# Patient Record
Sex: Female | Born: 1980 | Race: White | Hispanic: No | Marital: Married | Smoking: Current every day smoker
Health system: Southern US, Community
[De-identification: ages and names within clinical notes are randomized; demographics above are authoritative.]

## PROBLEM LIST (undated history)

## (undated) DIAGNOSIS — G43909 Migraine, unspecified, not intractable, without status migrainosus: Secondary | ICD-10-CM

## (undated) DIAGNOSIS — J45909 Unspecified asthma, uncomplicated: Secondary | ICD-10-CM

## (undated) DIAGNOSIS — E039 Hypothyroidism, unspecified: Secondary | ICD-10-CM

## (undated) DIAGNOSIS — E781 Pure hyperglyceridemia: Secondary | ICD-10-CM

## (undated) DIAGNOSIS — F329 Major depressive disorder, single episode, unspecified: Secondary | ICD-10-CM

## (undated) DIAGNOSIS — E559 Vitamin D deficiency, unspecified: Secondary | ICD-10-CM

## (undated) DIAGNOSIS — F419 Anxiety disorder, unspecified: Secondary | ICD-10-CM

## (undated) DIAGNOSIS — F32A Depression, unspecified: Secondary | ICD-10-CM

---

## 2013-10-31 ENCOUNTER — Encounter: Payer: Self-pay | Admitting: Emergency Medicine

## 2013-10-31 ENCOUNTER — Emergency Department (INDEPENDENT_AMBULATORY_CARE_PROVIDER_SITE_OTHER): Payer: BC Managed Care – PPO

## 2013-10-31 ENCOUNTER — Emergency Department
Admission: EM | Admit: 2013-10-31 | Discharge: 2013-10-31 | Disposition: A | Discharge status: Home / Self Care | Payer: BC Managed Care – PPO | Source: Home / Self Care | Attending: Family Medicine | Admitting: Family Medicine

## 2013-10-31 DIAGNOSIS — J45909 Unspecified asthma, uncomplicated: Secondary | ICD-10-CM | POA: Insufficient documentation

## 2013-10-31 DIAGNOSIS — E781 Pure hyperglyceridemia: Secondary | ICD-10-CM | POA: Insufficient documentation

## 2013-10-31 DIAGNOSIS — R059 Cough, unspecified: Secondary | ICD-10-CM

## 2013-10-31 DIAGNOSIS — F329 Major depressive disorder, single episode, unspecified: Secondary | ICD-10-CM | POA: Insufficient documentation

## 2013-10-31 DIAGNOSIS — E039 Hypothyroidism, unspecified: Secondary | ICD-10-CM | POA: Insufficient documentation

## 2013-10-31 DIAGNOSIS — F32A Depression, unspecified: Secondary | ICD-10-CM | POA: Insufficient documentation

## 2013-10-31 DIAGNOSIS — F419 Anxiety disorder, unspecified: Secondary | ICD-10-CM | POA: Insufficient documentation

## 2013-10-31 DIAGNOSIS — E559 Vitamin D deficiency, unspecified: Secondary | ICD-10-CM | POA: Insufficient documentation

## 2013-10-31 DIAGNOSIS — G43909 Migraine, unspecified, not intractable, without status migrainosus: Secondary | ICD-10-CM | POA: Insufficient documentation

## 2013-10-31 DIAGNOSIS — R05 Cough: Secondary | ICD-10-CM

## 2013-10-31 DIAGNOSIS — R61 Generalized hyperhidrosis: Secondary | ICD-10-CM

## 2013-10-31 DIAGNOSIS — R6883 Chills (without fever): Secondary | ICD-10-CM

## 2013-10-31 HISTORY — DX: Vitamin D deficiency, unspecified: E55.9

## 2013-10-31 HISTORY — DX: Migraine, unspecified, not intractable, without status migrainosus: G43.909

## 2013-10-31 HISTORY — DX: Unspecified asthma, uncomplicated: J45.909

## 2013-10-31 HISTORY — DX: Depression, unspecified: F32.A

## 2013-10-31 HISTORY — DX: Anxiety disorder, unspecified: F41.9

## 2013-10-31 HISTORY — DX: Pure hyperglyceridemia: E78.1

## 2013-10-31 HISTORY — DX: Hypothyroidism, unspecified: E03.9

## 2013-10-31 HISTORY — DX: Major depressive disorder, single episode, unspecified: F32.9

## 2013-10-31 LAB — POCT CBC W AUTO DIFF (K'VILLE URGENT CARE)

## 2013-10-31 MED ORDER — BENZONATATE 200 MG PO CAPS: 200.0000 mg | ORAL_CAPSULE | Freq: Every day | ORAL | Status: AC

## 2013-10-31 MED ORDER — BECLOMETHASONE DIPROPIONATE 40 MCG/ACT IN AERS: 2.0000 | INHALATION_SPRAY | Freq: Two times a day (BID) | RESPIRATORY_TRACT | Status: AC

## 2013-10-31 MED ORDER — LEVOFLOXACIN 500 MG PO TABS: 500.0000 mg | ORAL_TABLET | Freq: Every day | ORAL | Status: AC

## 2013-10-31 MED ORDER — PREDNISONE 20 MG PO TABS: 20.0000 mg | ORAL_TABLET | Freq: Two times a day (BID) | ORAL | Status: DC

## 2013-10-31 NOTE — ED Notes (Signed)
Erin Villarreal complains of dry cough for 3 weeks. She reports fevers, chills, sweats, body aches, dizziness, sore throat, congestion, wheezing, hoarseness and shortness of breath for 1 day. She has taken Mucinex DM, NyQuil and Robitussin with some relief.

## 2013-10-31 NOTE — ED Provider Notes (Signed)
CSN: [id number]     Arrival date & time 10/31/13  1745 History   First MD Initiated Contact with Patient 10/31/13 1755     Chief Complaint  Patient presents with  . Cough    x 3 weeks  . Generalized Body Aches    x 1 day  . Sore Throat    x 1 day      HPI Comments: Patient developed a sore throat and cough about 3 weeks ago, but not much nasal congestion.  Her non-productive cough has worsened and over the past several days she has had low grade fever and chills/sweats.  She has had increased wheezing and shortness of breath.  She has a history of asthma and has been using her albuterol inhaler more frequently. She has a history of pneumonia about 4 to 5 years ago.  She continues to smoke.  The history is provided by the patient.    Past Medical History  Diagnosis Date  . High triglycerides   . Hypothyroid   . Vitamin D deficiency   . Anxiety   . Depression   . Asthma   . Migraines    Past Surgical History  Procedure Laterality Date  . Cesarean section     Family History  Problem Relation Age of Onset  . Hyperlipidemia Mother   . Heart attack Mother   . Diabetes Father     Type 2  . Hyperlipidemia Father    History  Substance Use Topics  . Smoking status: Current Every Day Smoker -- 0.50 packs/day for 20 years    Types: Cigarettes  . Smokeless tobacco: Never Used  . Alcohol Use: No   OB History   Grav Para Term Preterm Abortions TAB SAB Ect Mult Living                 Review of Systems + sore throat, resolved + cough No pleuritic pain, but has burning sensation in anterior chest. + wheezing + nasal congestion + post-nasal drainage No sinus pain/pressure No itchy/red eyes No earache No hemoptysis + SOB + fever, + chills + nausea No vomiting No abdominal pain No diarrhea No urinary symptoms No skin rash + fatigue No myalgias No headache Used OTC meds without relief  Allergies  Aspirin; Biaxin; and Penicillins  Home Medications   Current  Outpatient Rx  Name  Route  Sig  Dispense  Refill  . albuterol (PROVENTIL HFA;VENTOLIN HFA) 108 (90 BASE) MCG/ACT inhaler   Inhalation   Inhale 2 puffs into the lungs every 6 (six) hours as needed for wheezing or shortness of breath.         . ALPRAZolam (XANAX) 0.25 MG tablet   Oral   Take 0.25 mg by mouth at bedtime as needed for anxiety.         . cetirizine (ZYRTEC) 10 MG tablet   Oral   Take 10 mg by mouth daily.         . Cholecalciferol (VITAMIN D) 2000 UNITS tablet   Oral   Take 2,000 Units by mouth daily.         . DULoxetine (CYMBALTA) 60 MG capsule   Oral   Take 60 mg by mouth daily.         . fenofibrate 160 MG tablet   Oral   Take 160 mg by mouth daily.         Marland Kitchen. gabapentin (NEURONTIN) 300 MG capsule   Oral   Take 300 mg by  mouth 3 (three) times daily.         Marland Kitchen levothyroxine (SYNTHROID, LEVOTHROID) 75 MCG tablet   Oral   Take 75 mcg by mouth daily before breakfast.         . omeprazole (PRILOSEC) 20 MG capsule   Oral   Take 20 mg by mouth daily.         . prochlorperazine (COMPAZINE) 5 MG tablet   Oral   Take 5 mg by mouth every 6 (six) hours as needed for nausea or vomiting.         Marland Kitchen zolpidem (AMBIEN) 10 MG tablet   Oral   Take 10 mg by mouth at bedtime as needed for sleep.         . beclomethasone (QVAR) 40 MCG/ACT inhaler   Inhalation   Inhale 2 puffs into the lungs 2 (two) times daily.   1 Inhaler   0   . benzonatate (TESSALON) 200 MG capsule   Oral   Take 1 capsule (200 mg total) by mouth at bedtime. Take as needed for cough   12 capsule   0   . levofloxacin (LEVAQUIN) 500 MG tablet   Oral   Take 1 tablet (500 mg total) by mouth daily.   10 tablet   0    BP 115/82  Pulse 68  Temp(Src) 98.7 F (37.1 C) (Oral)  Ht 4' 11.5" (1.511 m)  Wt 151 lb (68.493 kg)  BMI 30.00 kg/m2  SpO2 97% Physical Exam Nursing notes and Vital Signs reviewed. Appearance:  Patient appears stated age, and in no acute distress.   Patient is obese (BMI 30.0) Eyes:  Pupils are equal, round, and reactive to light and accomodation.  Extraocular movement is intact.  Conjunctivae are not inflamed  Ears:  Canals normal.  Tympanic membranes normal.  Nose:   Moderately congested turbinates.  No sinus tenderness.   Pharynx:  Normal Neck:  Supple.  Tender enlarged posterior nodes are palpated bilaterally  Lungs:  Clear to auscultation.  Breath sounds are equal.  Heart:  Regular rate and rhythm without murmurs, rubs, or gallops.  Abdomen:  Nontender without masses or hepatosplenomegaly.  Bowel sounds are present.  No CVA or flank tenderness.  Extremities:  No edema.  No calf tenderness Skin:  No rash present.   ED Course  Procedures  none    Labs Reviewed  POCT CBC W AUTO DIFF ([facility]):  WBC 5.2; LY 39.4; MO 10.5; GR 50.1; Hgb 14.3; Platelets 327    Imaging Review DG Chest 2 View (Final result)  Result time: 10/31/13 18:38:09    Final result by Rad Results In Interface (10/31/13 18:38:09)    Narrative:   CLINICAL DATA: Persistent cough. Chills, night sweats.  EXAM: CHEST 2 VIEW  COMPARISON: None.  FINDINGS: The heart size and mediastinal contours are within normal limits. Both lungs are clear. The visualized skeletal structures are unremarkable.  IMPRESSION: No active cardiopulmonary disease.   Electronically Signed By: Charlett Nose M.D. On: 10/31/2013 18:38      MDM   1. Asthmatic bronchitis    Begin Levaquin and QVAR inhaler.  Prescription written for Benzonatate [facility]) to take at bedtime for night-time cough.   Take plain Mucinex (1200 mg guaifenesin) twice daily for cough and congestion.  May add Sudafed for sinus congestion.   Increase fluid intake, rest. Avoid taking Compazine while taking Levaquin. May use Afrin nasal spray (or generic oxymetazoline) for sinus congestion twice daily for about 5  days.  Also recommend using saline nasal spray several times daily and saline nasal  irrigation (AYR is a common brand) Try warm salt water gargles for sore throat.  Stop all antihistamines for now, and other non-prescription cough/cold preparations. Continue albuterol inhaler as needed. Follow-up with family doctor if not improving about one week.   Lattie Haw, MD 11/03/13 1027

## 2013-10-31 NOTE — Discharge Instructions (Signed)
Take plain Mucinex (1200 mg guaifenesin) twice daily for cough and congestion.  May add Sudafed for sinus congestion.   Increase fluid intake, rest. Avoid taking Compazine while taking Levaquin. May use Afrin nasal spray (or generic oxymetazoline) for sinus congestion twice daily for about 5 days.  Also recommend using saline nasal spray several times daily and saline nasal irrigation (AYR is a common brand) Try warm salt water gargles for sore throat.  Stop all antihistamines for now, and other non-prescription cough/cold preparations. Continue albuterol inhaler as needed. Follow-up with family doctor if not improving about one week.

## 2015-03-29 IMAGING — CR DG CHEST 2V
2 series · 2 of 2 positions shown · non-contrast
Comparison: None.

CLINICAL DATA: Persistent cough.  Chills, night sweats.

EXAM:
CHEST  2 VIEW

[view not recorded (1 of 2)]
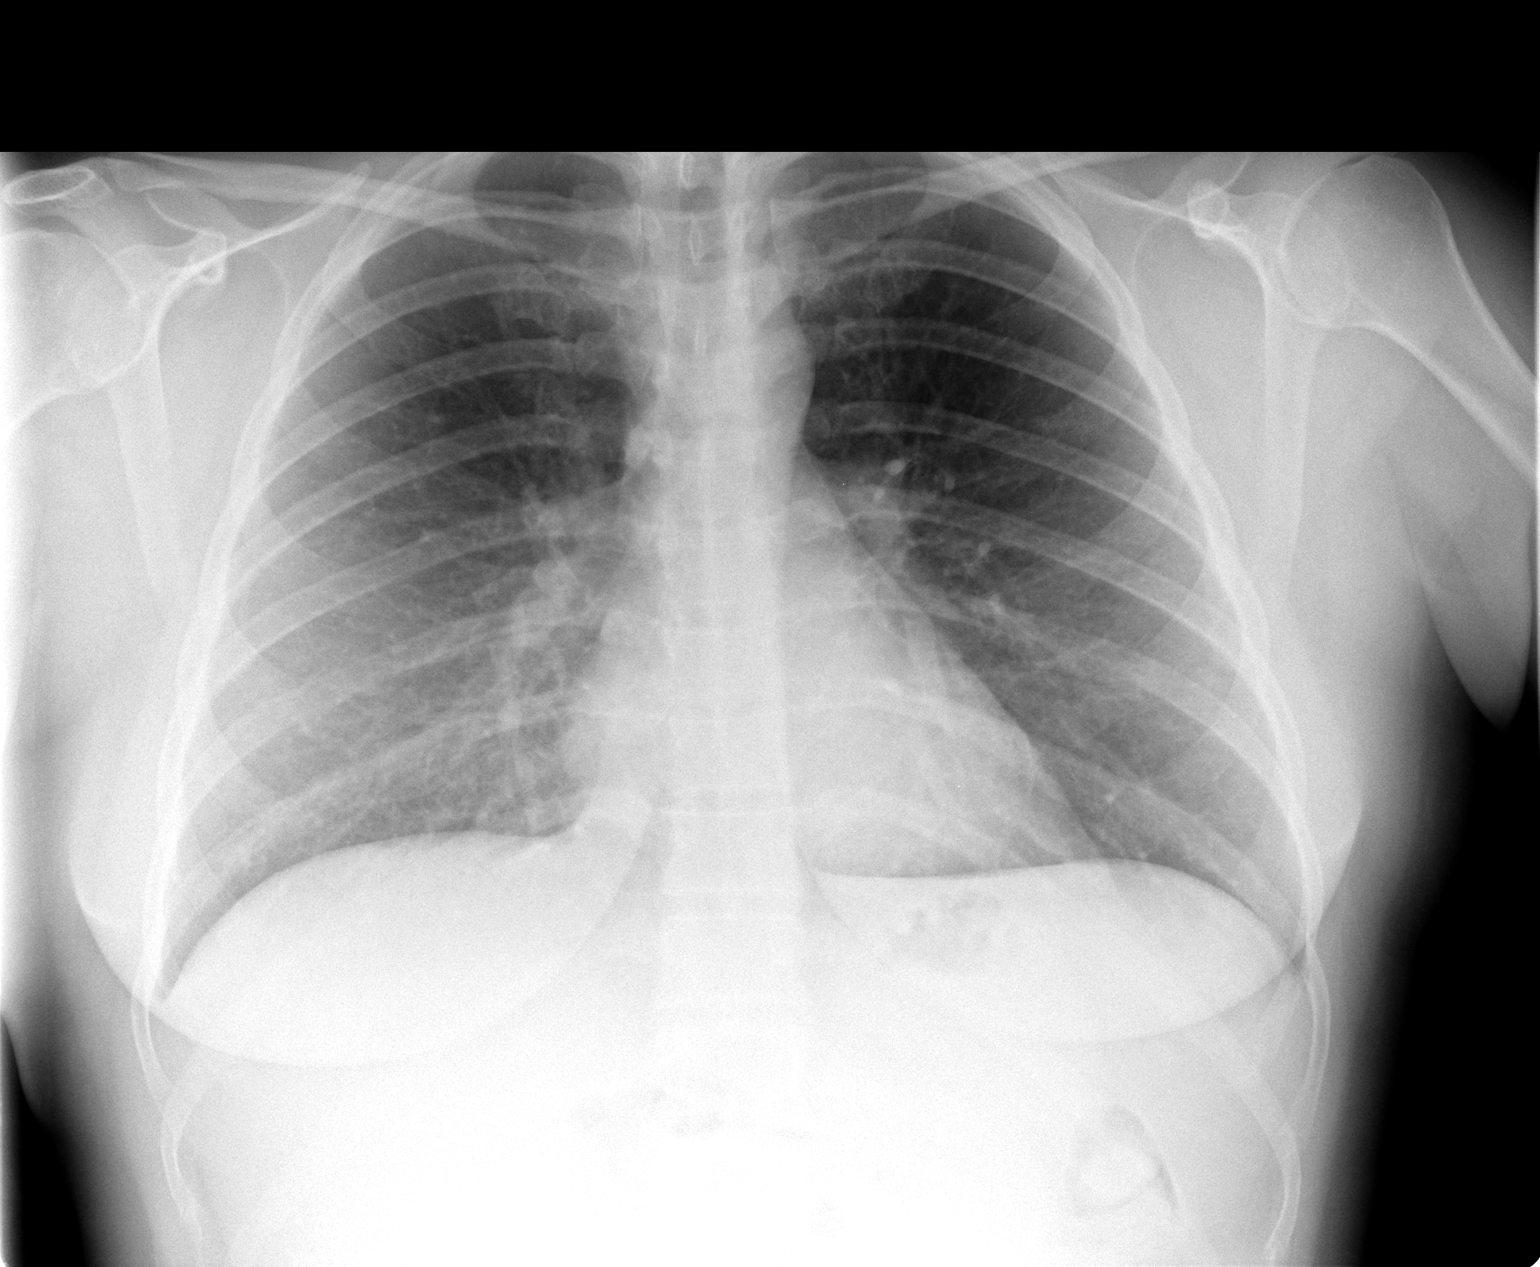

[view not recorded (2 of 2)]
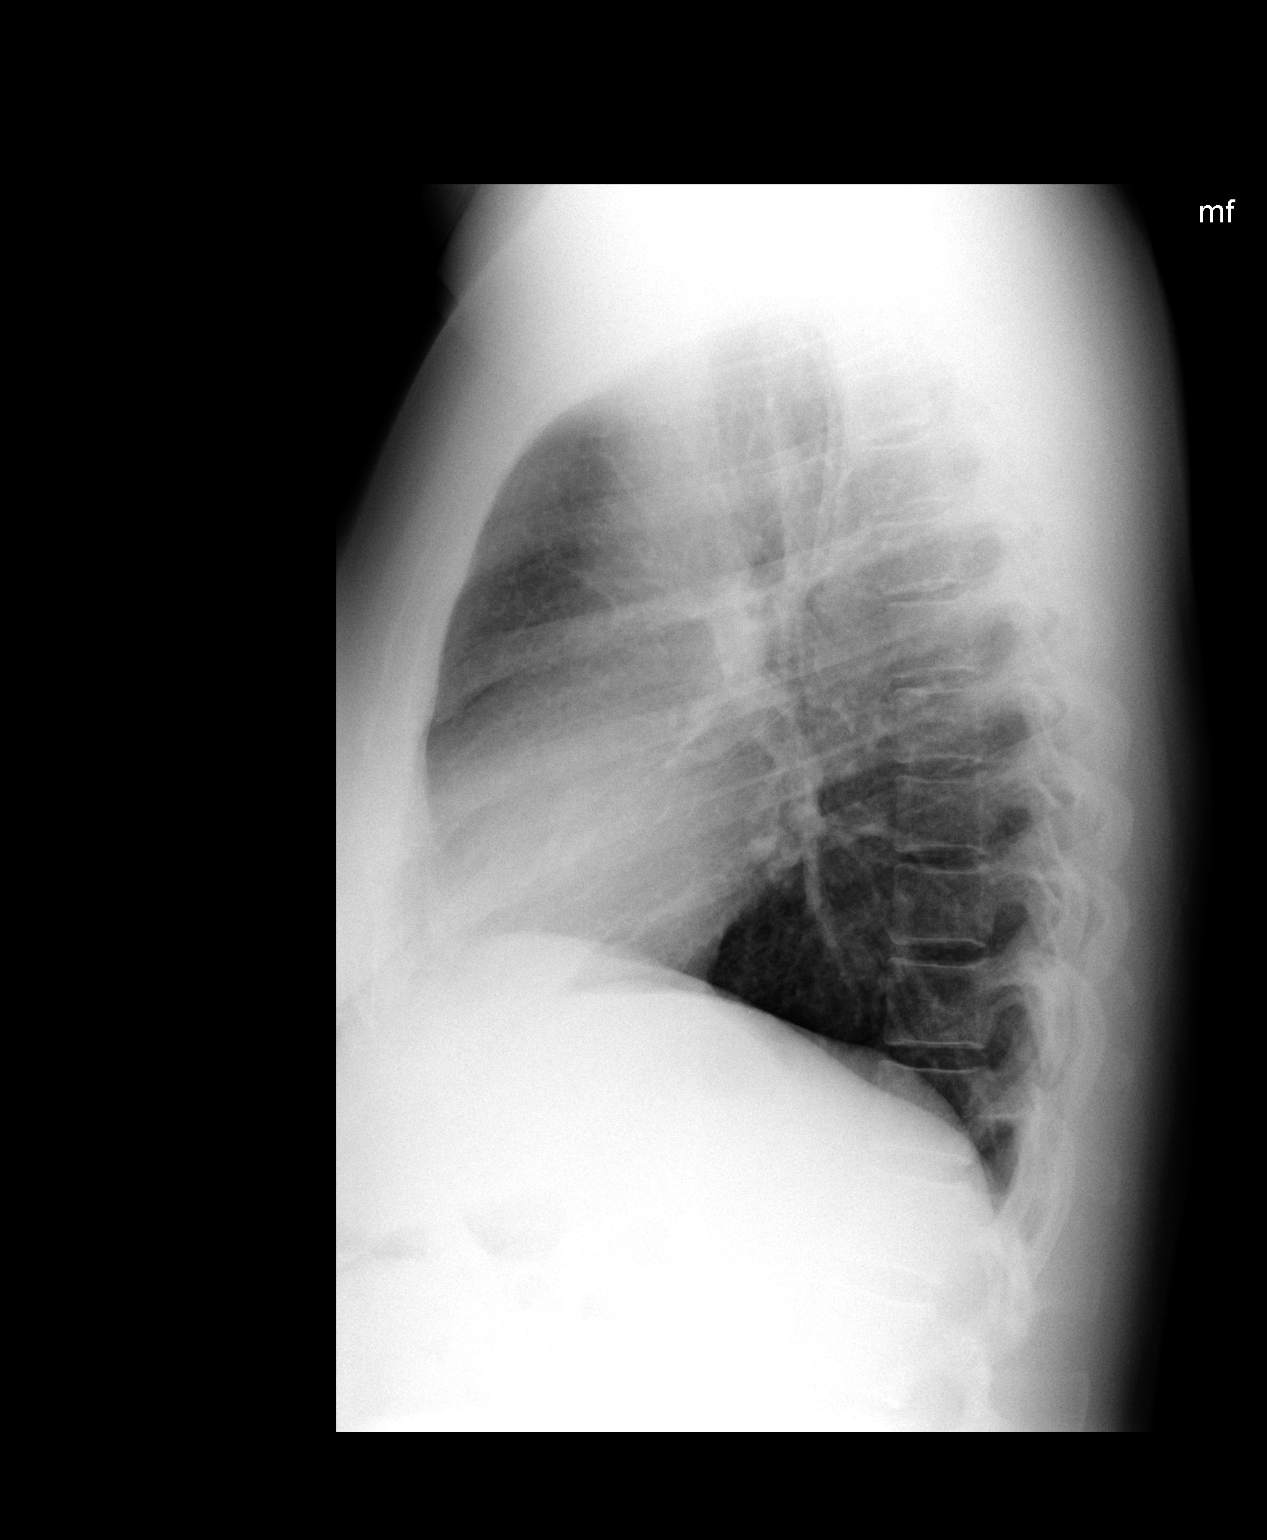

[2 of 2 positions shown; findings below may reference images not displayed]

FINDINGS: The heart size and mediastinal contours are within normal limits.
Both lungs are clear. The visualized skeletal structures are
unremarkable.
IMPRESSION: No active cardiopulmonary disease.

## 2024-08-24 ENCOUNTER — Other Ambulatory Visit: Payer: Self-pay

## 2024-08-24 ENCOUNTER — Emergency Department (HOSPITAL_COMMUNITY)
Admission: EM | Admit: 2024-08-24 | Discharge: 2024-08-25 | Disposition: A | Discharge status: Home / Self Care | Attending: Emergency Medicine | Admitting: Emergency Medicine

## 2024-08-24 ENCOUNTER — Encounter (HOSPITAL_COMMUNITY): Payer: Self-pay

## 2024-08-24 DIAGNOSIS — R519 Headache, unspecified: Secondary | ICD-10-CM | POA: Diagnosis present

## 2024-08-24 DIAGNOSIS — Z79899 Other long term (current) drug therapy: Secondary | ICD-10-CM | POA: Insufficient documentation

## 2024-08-24 DIAGNOSIS — G932 Benign intracranial hypertension: Secondary | ICD-10-CM | POA: Diagnosis not present

## 2024-08-24 NOTE — ED Triage Notes (Signed)
 Patient reports head and neck pressure since 08/06/24. She reports numbness and tingling in her hands and feet and under her chin. She reports visual changes that come and go and she is nauseous.  She has a a history of IIH and chronic migraines.

## 2024-08-24 NOTE — ED Triage Notes (Signed)
 Pt also was recently admitted today to [facility] medical center, had CT done there as well, she felt like they was not address her issue of the head pressure tho. 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

## 2024-08-25 LAB — BASIC METABOLIC PANEL WITH GFR
Anion gap: 10 (ref 5–15)
BUN: 7 mg/dL (ref 6–20)
CO2: 21 mmol/L — ABNORMAL LOW (ref 22–32)
Calcium: 8.6 mg/dL — ABNORMAL LOW (ref 8.9–10.3)
Chloride: 108 mmol/L (ref 98–111)
Creatinine, Ser: 0.67 mg/dL (ref 0.44–1.00)
GFR, Estimated: >60 mL/min
Glucose, Bld: 133 mg/dL — ABNORMAL HIGH (ref 70–99)
Potassium: 3.8 mmol/L (ref 3.5–5.1)
Sodium: 139 mmol/L (ref 135–145)

## 2024-08-25 LAB — CBC WITH DIFFERENTIAL/PLATELET
Abs Immature Granulocytes: 0.12 K/uL — ABNORMAL HIGH (ref 0.00–0.07)
Basophils Absolute: 0.1 K/uL (ref 0.0–0.1)
Basophils Relative: 1 %
Eosinophils Absolute: 0.1 K/uL (ref 0.0–0.5)
Eosinophils Relative: 2 %
HCT: 39.1 % (ref 36.0–46.0)
Hemoglobin: 13.5 g/dL (ref 12.0–15.0)
Immature Granulocytes: 1 %
Lymphocytes Relative: 32 %
Lymphs Abs: 2.8 K/uL (ref 0.7–4.0)
MCH: 32.1 pg (ref 26.0–34.0)
MCHC: 34.5 g/dL (ref 30.0–36.0)
MCV: 93.1 fL (ref 80.0–100.0)
Monocytes Absolute: 0.7 K/uL (ref 0.1–1.0)
Monocytes Relative: 8 %
Neutro Abs: 4.8 K/uL (ref 1.7–7.7)
Neutrophils Relative %: 56 %
Platelets: 335 K/uL (ref 150–400)
RBC: 4.2 MIL/uL (ref 3.87–5.11)
RDW: 12.2 % (ref 11.5–15.5)
WBC: 8.6 K/uL (ref 4.0–10.5)
nRBC: 0 % (ref 0.0–0.2)

## 2024-08-25 LAB — CSF CELL COUNT WITH DIFFERENTIAL
RBC Count, CSF: 0 /mm3
RBC Count, CSF: 1 /mm3 — ABNORMAL HIGH
Tube #: 1
Tube #: 4
WBC, CSF: 1 /mm3 (ref 0–5)
WBC, CSF: 1 /mm3 (ref 0–5)

## 2024-08-25 LAB — PROTEIN, CSF: Total  Protein, CSF: 31 mg/dL (ref 15–45)

## 2024-08-25 LAB — GLUCOSE, CSF: Glucose, CSF: 77 mg/dL — ABNORMAL HIGH (ref 40–70)

## 2024-08-25 MED ORDER — LIDOCAINE HCL (PF) 1 % IJ SOLN
30.0000 mL | Freq: Once | INTRAMUSCULAR | Status: AC
  Administered 2024-08-25 (×2): 30 mL
  Filled 2024-08-25: qty 30

## 2024-08-25 MED ORDER — LACTATED RINGERS IV BOLUS
1000.0000 mL | Freq: Once | INTRAVENOUS | Status: AC
  Administered 2024-08-25: 1000 mL via INTRAVENOUS

## 2024-08-25 NOTE — ED Provider Notes (Addendum)
 " [place] EMERGENCY DEPARTMENT AT [facility] Provider Note   CSN: [id number] Arrival date & time: 08/24/24  2139     Patient presents with: Headache   Erin Villarreal is a 44 y.o. female.   Presents to the emergency department for evaluation of headache.  Patient has a history of chronic migraines as well as idiopathic intracranial hypertension.  Patient was admitted to [facility] earlier this month for trials of DHE to see if her headaches were related to her migraines versus the IIH.  Patient was seen at outside hospital today in the emergency department for recurrent headache, told she needed to schedule follow-up for a lumbar puncture.  She presents to the ED today for intervention.       Prior to Admission medications  Medication Sig Start Date End Date Taking? Authorizing Provider  albuterol (PROVENTIL HFA;VENTOLIN HFA) 108 (90 BASE) MCG/ACT inhaler Inhale 2 puffs into the lungs every 6 (six) hours as needed for wheezing or shortness of breath.    [provider]  ALPRAZolam (XANAX) 0.25 MG tablet Take 0.25 mg by mouth at bedtime as needed for anxiety.    [provider]  beclomethasone (QVAR ) 40 MCG/ACT inhaler Inhale 2 puffs into the lungs 2 (two) times daily. 10/31/13   Pauline Garnette LABOR, MD  benzonatate  (TESSALON ) 200 MG capsule Take 1 capsule (200 mg total) by mouth at bedtime. Take as needed for cough 10/31/13   Pauline Garnette LABOR, MD  cetirizine (ZYRTEC) 10 MG tablet Take 10 mg by mouth daily.    [provider]  Cholecalciferol (VITAMIN D) 2000 UNITS tablet Take 2,000 Units by mouth daily.    [provider]  DULoxetine (CYMBALTA) 60 MG capsule Take 60 mg by mouth daily.    [provider]  fenofibrate 160 MG tablet Take 160 mg by mouth daily.    [provider]  gabapentin (NEURONTIN) 300 MG capsule Take 300 mg by mouth 3 (three) times daily.    [provider]  levofloxacin  (LEVAQUIN ) 500 MG  tablet Take 1 tablet (500 mg total) by mouth daily. 10/31/13   Pauline Garnette LABOR, MD  levothyroxine (SYNTHROID, LEVOTHROID) 75 MCG tablet Take 75 mcg by mouth daily before breakfast.    [provider]  omeprazole (PRILOSEC) 20 MG capsule Take 20 mg by mouth daily.    [provider]  prochlorperazine (COMPAZINE) 5 MG tablet Take 5 mg by mouth every 6 (six) hours as needed for nausea or vomiting.    [provider]  zolpidem (AMBIEN) 10 MG tablet Take 10 mg by mouth at bedtime as needed for sleep.    [provider]    Allergies: Aspirin, Biaxin [clarithromycin], and Penicillins    Review of Systems  Updated Vital Signs BP 133/83 (BP Location: Right Arm)   Pulse (!) 58   Temp 97.7 F (36.5 C) (Oral)   Resp (!) 21   SpO2 100%   Physical Exam Vitals and nursing note reviewed.  Constitutional:      General: She is not in acute distress.    Appearance: She is well-developed.  HENT:     Head: Normocephalic and atraumatic.     Mouth/Throat:     Mouth: Mucous membranes are moist.  Eyes:     General: Vision grossly intact. Gaze aligned appropriately.     Extraocular Movements: Extraocular movements intact.     Conjunctiva/sclera: Conjunctivae normal.  Cardiovascular:     Rate and Rhythm: Normal rate and  regular rhythm.     Pulses: Normal pulses.     Heart sounds: Normal heart sounds, S1 normal and S2 normal. No murmur heard.    No friction rub. No gallop.  Pulmonary:     Effort: Pulmonary effort is normal. No respiratory distress.     Breath sounds: Normal breath sounds.  Abdominal:     General: Bowel sounds are normal.     Palpations: Abdomen is soft.     Tenderness: There is no abdominal tenderness. There is no guarding or rebound.     Hernia: No hernia is present.  Musculoskeletal:        General: No swelling.     Cervical back: Full passive range of motion without pain, normal range of motion and neck supple. No spinous process tenderness  or muscular tenderness. Normal range of motion.     Right lower leg: No edema.     Left lower leg: No edema.  Skin:    General: Skin is warm and dry.     Capillary Refill: Capillary refill takes less than 2 seconds.     Findings: No ecchymosis, erythema, rash or wound.  Neurological:     General: No focal deficit present.     Mental Status: She is alert and oriented to person, place, and time.     GCS: GCS eye subscore is 4. GCS verbal subscore is 5. GCS motor subscore is 6.     Cranial Nerves: Cranial nerves 2-12 are intact.     Sensory: Sensation is intact.     Motor: Motor function is intact.     Coordination: Coordination is intact.  Psychiatric:        Attention and Perception: Attention normal.        Mood and Affect: Mood normal.        Speech: Speech normal.        Behavior: Behavior normal.     (all labs ordered are listed, but only abnormal results are displayed) Labs Reviewed  GLUCOSE, CSF - Abnormal; Notable for the following components:      Result Value   Glucose, CSF 77 (*)    All other components within normal limits  CBC WITH DIFFERENTIAL/PLATELET - Abnormal; Notable for the following components:   Abs Immature Granulocytes 0.12 (*)    All other components within normal limits  BASIC METABOLIC PANEL WITH GFR - Abnormal; Notable for the following components:   CO2 21 (*)    Glucose, Bld 133 (*)    Calcium 8.6 (*)    All other components within normal limits  CSF CULTURE W GRAM STAIN  GRAM STAIN  PROTEIN, CSF  CSF CELL COUNT WITH DIFFERENTIAL  CSF CELL COUNT WITH DIFFERENTIAL    EKG: None  Radiology: No results found.   Lumbar Puncture  Date/Time: 08/25/2024 2:36 AM  Performed by: Haze Lonni PARAS, MD Authorized by: Haze Lonni PARAS, MD   Consent:    Consent obtained:  Verbal and written   Consent given by:  Patient   Risks, benefits, and alternatives were discussed: yes     Risks discussed:  Infection, pain, repeat procedure and  headache   Alternatives discussed:  Delayed treatment Universal protocol:    Procedure explained and questions answered to patient or proxy's satisfaction: yes     Relevant documents present and verified: yes     Test results available: yes     Imaging studies available: yes     Required blood products, implants, devices, and  special equipment available: yes     Immediately prior to procedure a time out was called: yes     Site/side marked: yes     Patient identity confirmed:  Verbally with patient Pre-procedure details:    Procedure purpose:  Therapeutic   Preparation: Patient was prepped and draped in usual sterile fashion   Anesthesia:    Anesthesia method:  Local infiltration   Local anesthetic:  30 mL lidocaine  (XYLOCAINE ) injection 1 % (PF) Procedure details:    Lumbar space:  L3-L4 interspace   Patient position:  L lateral decubitus   Needle gauge:  20   Needle type:  Spinal needle - Quincke tip   Needle length (in):  3.5   Ultrasound guidance: no     Number of attempts:  1   Opening pressure (cm H2O):  31   Closing pressure (cm H2O):  10   Fluid appearance:  Clear   Tubes of fluid:  4   Total volume (ml):  20 Post-procedure details:    Puncture site:  Adhesive bandage applied   Procedure completion:  Tolerated well, no immediate complications .Critical Care  Performed by: Haze Lonni PARAS, MD Authorized by: Haze Lonni PARAS, MD   Critical care provider statement:    Critical care time (minutes):  30   Critical care time was exclusive of:  Separately billable procedures and treating other patients   Critical care was time spent personally by me on the following activities:  Development of treatment plan with patient or surrogate, discussions with consultants, evaluation of patient's response to treatment, examination of patient, ordering and review of laboratory studies, ordering and review of radiographic studies, ordering and performing treatments and  interventions, pulse oximetry, re-evaluation of patient's condition and review of old charts   I assumed direction of critical care for this patient from another provider in my specialty: no      Medications Ordered in the ED  lactated ringers  bolus 1,000 mL (1,000 mLs Intravenous New Bag/Given 08/25/24 0132)  lidocaine  (PF) (XYLOCAINE ) 1 % injection 30 mL (30 mLs Other Given by Other 08/25/24 0200)                                    Medical Decision Making Amount and/or Complexity of Data Reviewed External Data Reviewed: labs, radiology, ECG and notes. Labs: ordered. Decision-making details documented in ED Course.  Risk Prescription drug management.   Differential Diagnosis considered includes, but not limited to: Hypertensive emergencies, Idiopathic intracranial hypertension, Space occupying lesions (tumors, abscesses, cysts), Acute hydrocephalus, Dural sinus thrombosis, Intracranial hemorrhage, Cerebrovascular accident or stroke, Meningitis and encephalitis, Migraine HA, Tension HA.  Presents to the emergency department for evaluation of headache.  Patient does have a complex history.  She does have a known history of idiopathic intracranial hypertension but also chronic migraines.  Patient recently hospitalized at [facility] for trial of DHE to determine if her headaches were secondary to migraines.  Documentation from that visit shows that they did think that her headaches improved with this treatment but she was to follow-up with her neurologist to consider diagnostic/therapeutic LP as well.  Patient reports that she did schedule that follow-up but her neurologist then canceled the visit, saying she did not need an LP.  Patient's headaches have persisted.  She was seen at the emergency department at [facility] earlier today, had lab work and a  CT head, all of which were normal.  She was discharged and told to follow-up with her  neurologist.  Patient presents here stating that she thinks she needs a lumbar puncture.  Patient does indicate that her headache does seem similar to the headaches that she has had with her elevated intracranial pressure in the past.  Discussed briefly with Dr. Michaela, on-call for neurology.  He had reviewed the records available in care everywhere and did feel it was reasonable to proceed with lumbar puncture.  I did perform this procedure without difficulty.  She did have an elevated opening pressure, normal closing pressure after the procedure.  Basic CFS studies are unremarkable.  Patient absolutely cannot tolerate Diamox.  Based on this, Dr. Michaela recommends increasing her Topamax to 75 mg p.o. twice daily and get prompt follow-up with her neurologist as an outpatient.       Final diagnoses:  IIH (idiopathic intracranial hypertension)    ED Discharge Orders     None          Makye Radle, Lonni PARAS, MD 08/25/24 9641    Haze Lonni PARAS, MD 08/25/24 0422  "

## 2024-08-25 NOTE — Discharge Instructions (Signed)
 Increase your Topamax dose to 75 mg twice a day.  Schedule prompt follow-up with your neurologist for further management.

## 2024-08-25 NOTE — ED Notes (Signed)
Pt ambulated to restroom, pt tolerated well.  

## 2024-08-28 LAB — CSF CULTURE W GRAM STAIN
Culture: NO GROWTH
Gram Stain: NONE SEEN
